# Patient Record
Sex: Male | Born: 2005 | Race: White | Hispanic: No | Marital: Single
Health system: Southern US, Community
[De-identification: ages and names within clinical notes are randomized; demographics above are authoritative.]

---
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
## 2023-05-21 IMAGING — DX DG ANKLE 2V *R*
2 series · 2 of 2 positions shown · non-contrast
Comparison: None.

CLINICAL DATA: Right ankle pain for several years, no known injury,
initial encounter

EXAM:
RIGHT ANKLE - 2 VIEW

[dg ankle 2 views right (1 of 2)]
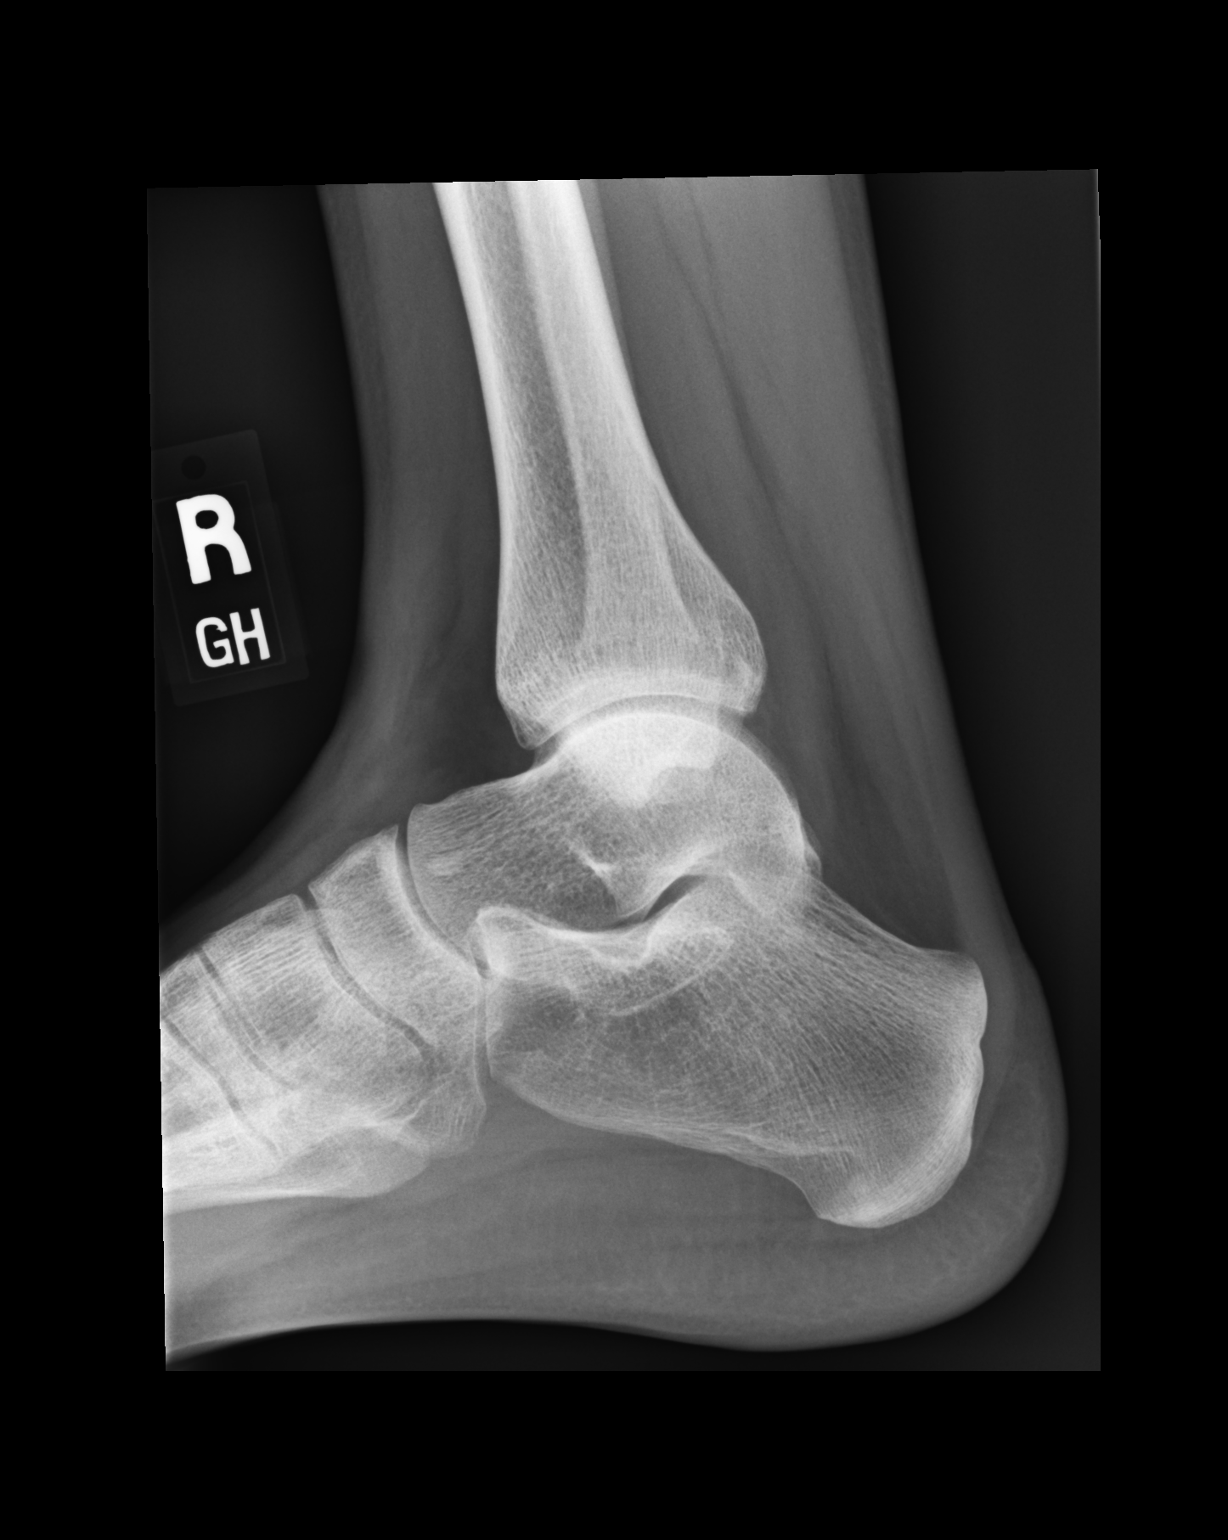

[dg ankle 2 views right (2 of 2)]
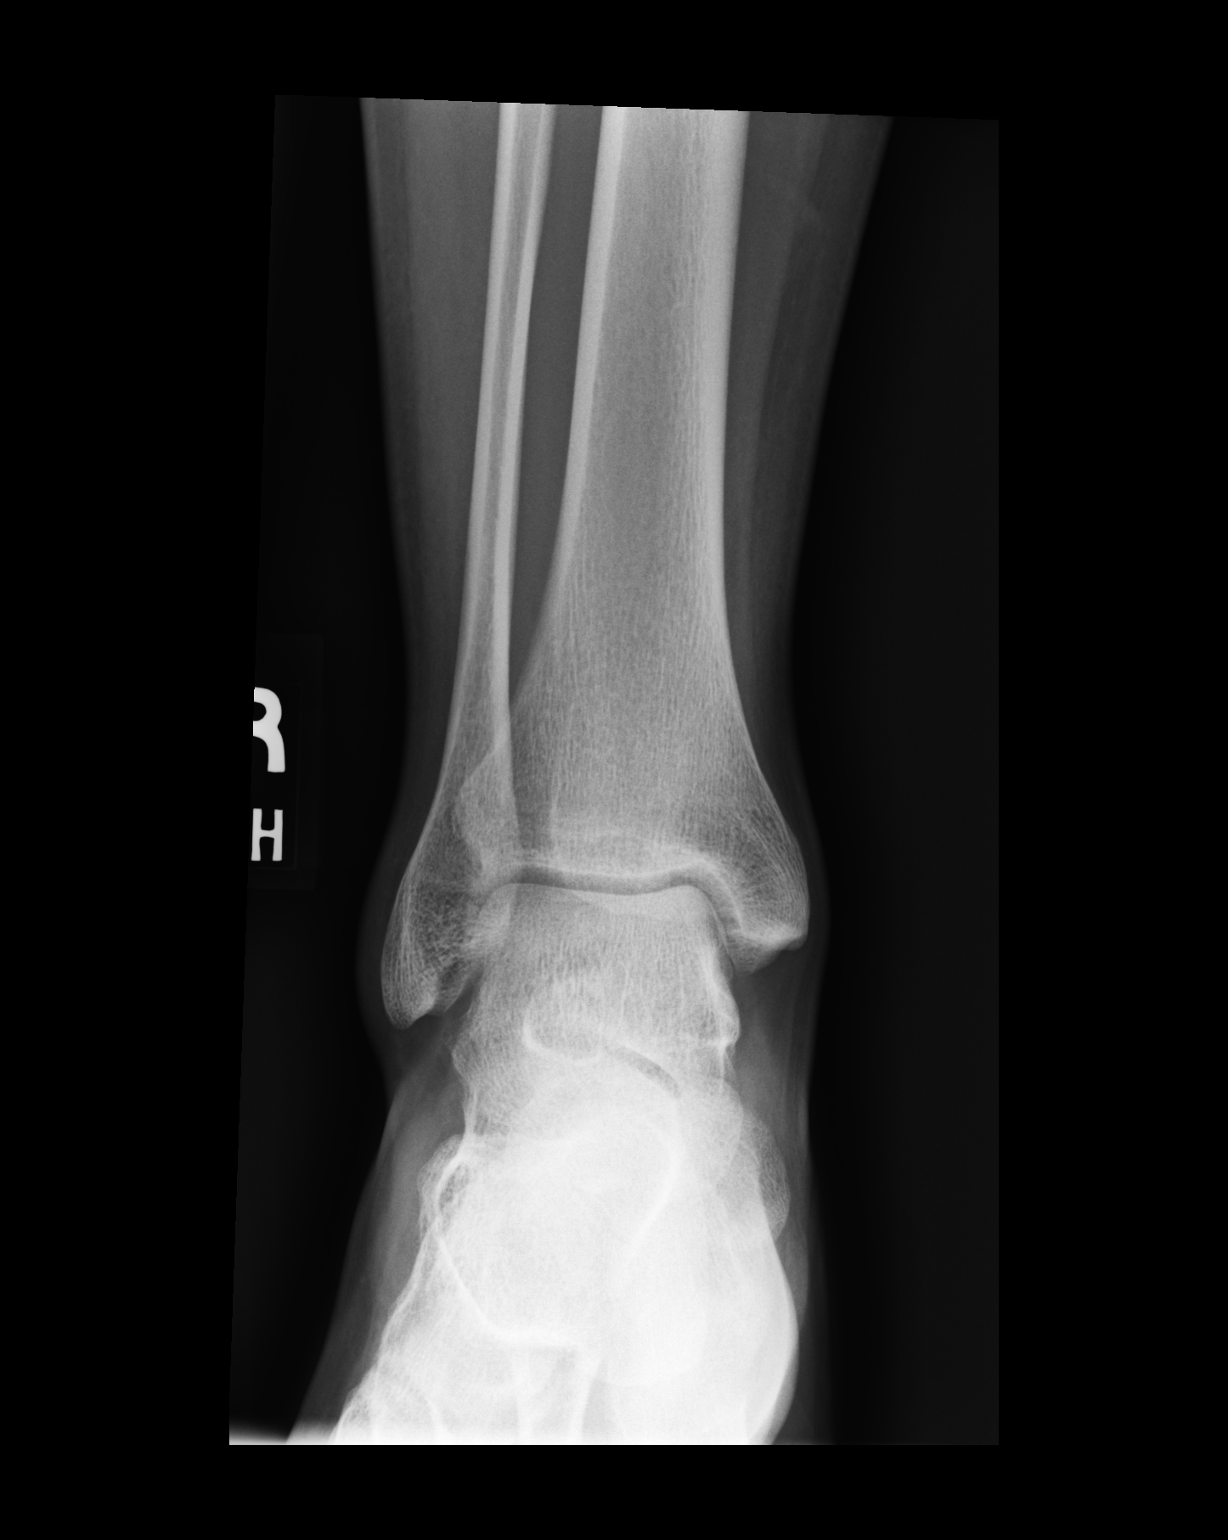

[2 of 2 positions shown; findings below may reference images not displayed]

FINDINGS: There is no evidence of fracture, dislocation, or joint effusion.
There is no evidence of arthropathy or other focal bone abnormality.
Soft tissues are unremarkable.
IMPRESSION: No acute abnormality noted.
